# Patient Record
Sex: Male | Born: 1970 | Race: Black or African American | Hispanic: No | Marital: Married | Smoking: Current every day smoker
Health system: Southern US, Community
[De-identification: ages and names within clinical notes are randomized; demographics above are authoritative.]

## PROBLEM LIST (undated history)

## (undated) DIAGNOSIS — F319 Bipolar disorder, unspecified: Secondary | ICD-10-CM

## (undated) DIAGNOSIS — S0990XA Unspecified injury of head, initial encounter: Secondary | ICD-10-CM

## (undated) HISTORY — DX: Bipolar disorder, unspecified: F31.9

## (undated) HISTORY — PX: ACHILLES TENDON SURGERY: SHX542

## (undated) HISTORY — PX: OTHER SURGICAL HISTORY: SHX169

## (undated) HISTORY — DX: Unspecified injury of head, initial encounter: S09.90XA

---

## 2001-04-13 ENCOUNTER — Observation Stay (HOSPITAL_COMMUNITY): Admission: EM | Admit: 2001-04-13 | Discharge: 2001-04-13 | Payer: Self-pay | Admitting: Emergency Medicine

## 2002-09-07 ENCOUNTER — Encounter: Admission: RE | Admit: 2002-09-07 | Discharge: 2002-09-07 | Payer: Self-pay | Admitting: Family Medicine

## 2002-09-07 ENCOUNTER — Encounter: Payer: Self-pay | Admitting: Family Medicine

## 2009-01-01 ENCOUNTER — Emergency Department (HOSPITAL_COMMUNITY): Admission: EM | Admit: 2009-01-01 | Discharge: 2009-01-01 | Payer: Self-pay | Admitting: Emergency Medicine

## 2009-03-05 ENCOUNTER — Emergency Department (HOSPITAL_COMMUNITY): Admission: EM | Admit: 2009-03-05 | Discharge: 2009-03-05 | Payer: Self-pay | Admitting: Emergency Medicine

## 2009-03-11 ENCOUNTER — Emergency Department (HOSPITAL_COMMUNITY): Admission: EM | Admit: 2009-03-11 | Discharge: 2009-03-11 | Payer: Self-pay | Admitting: Emergency Medicine

## 2010-03-09 ENCOUNTER — Inpatient Hospital Stay (HOSPITAL_COMMUNITY): Admission: EM | Admit: 2010-03-09 | Discharge: 2010-03-13 | Payer: Self-pay | Admitting: Emergency Medicine

## 2010-04-28 ENCOUNTER — Encounter: Admission: RE | Admit: 2010-04-28 | Discharge: 2010-07-27 | Payer: Self-pay | Admitting: Orthopedic Surgery

## 2010-06-23 ENCOUNTER — Ambulatory Visit (HOSPITAL_BASED_OUTPATIENT_CLINIC_OR_DEPARTMENT_OTHER): Admission: RE | Admit: 2010-06-23 | Discharge: 2010-06-23 | Payer: Self-pay | Admitting: Orthopedic Surgery

## 2010-07-28 ENCOUNTER — Encounter: Admission: RE | Admit: 2010-07-28 | Discharge: 2010-09-04 | Payer: Self-pay | Admitting: Orthopedic Surgery

## 2011-01-01 LAB — POCT I-STAT, CHEM 8
BUN: 9 mg/dL (ref 6–23)
Creatinine, Ser: 1 mg/dL (ref 0.4–1.5)
Glucose, Bld: 150 mg/dL — ABNORMAL HIGH (ref 70–99)
Potassium: 3.2 mEq/L — ABNORMAL LOW (ref 3.5–5.1)
Sodium: 140 mEq/L (ref 135–145)

## 2011-01-01 LAB — CBC
HCT: 28.5 % — ABNORMAL LOW (ref 39.0–52.0)
HCT: 39.4 % (ref 39.0–52.0)
Hemoglobin: 10.7 g/dL — ABNORMAL LOW (ref 13.0–17.0)
Hemoglobin: 9.7 g/dL — ABNORMAL LOW (ref 13.0–17.0)
MCHC: 33.4 g/dL (ref 30.0–36.0)
MCHC: 33.8 g/dL (ref 30.0–36.0)
MCV: 92 fL (ref 78.0–100.0)
MCV: 92.2 fL (ref 78.0–100.0)
MCV: 92.7 fL (ref 78.0–100.0)
MCV: 93.2 fL (ref 78.0–100.0)
Platelets: 137 10*3/uL — ABNORMAL LOW (ref 150–400)
Platelets: 182 10*3/uL (ref 150–400)
RDW: 12.1 % (ref 11.5–15.5)
RDW: 12.3 % (ref 11.5–15.5)
RDW: 12.6 % (ref 11.5–15.5)

## 2011-01-01 LAB — GLUCOSE, CAPILLARY

## 2011-01-01 LAB — COMPREHENSIVE METABOLIC PANEL
Albumin: 3.8 g/dL (ref 3.5–5.2)
BUN: 10 mg/dL (ref 6–23)
CO2: 24 mEq/L (ref 19–32)
Creatinine, Ser: 1.13 mg/dL (ref 0.4–1.5)
Glucose, Bld: 152 mg/dL — ABNORMAL HIGH (ref 70–99)
Sodium: 138 mEq/L (ref 135–145)
Total Bilirubin: 0.9 mg/dL (ref 0.3–1.2)
Total Protein: 6.8 g/dL (ref 6.0–8.3)

## 2011-01-01 LAB — BASIC METABOLIC PANEL
BUN: 5 mg/dL — ABNORMAL LOW (ref 6–23)
CO2: 27 mEq/L (ref 19–32)
CO2: 29 mEq/L (ref 19–32)
Calcium: 8.2 mg/dL — ABNORMAL LOW (ref 8.4–10.5)
Calcium: 8.3 mg/dL — ABNORMAL LOW (ref 8.4–10.5)
Chloride: 106 mEq/L (ref 96–112)
Creatinine, Ser: 0.83 mg/dL (ref 0.4–1.5)
GFR calc Af Amer: >60 mL/min (ref >60)
Glucose, Bld: 102 mg/dL — ABNORMAL HIGH (ref 70–99)
Glucose, Bld: 106 mg/dL — ABNORMAL HIGH (ref 70–99)
Glucose, Bld: 113 mg/dL — ABNORMAL HIGH (ref 70–99)
Potassium: 3.8 mEq/L (ref 3.5–5.1)
Potassium: 3.9 mEq/L (ref 3.5–5.1)
Sodium: 141 mEq/L (ref 135–145)

## 2011-01-01 LAB — PROTIME-INR
INR: 1.17 (ref 0.00–1.49)
INR: 2.03 — ABNORMAL HIGH (ref 0.00–1.49)
Prothrombin Time: 14.1 seconds (ref 11.6–15.2)
Prothrombin Time: 14.8 seconds (ref 11.6–15.2)

## 2011-01-01 LAB — SAMPLE TO BLOOD BANK

## 2011-01-01 LAB — LACTIC ACID, PLASMA: Lactic Acid, Venous: 1.6 mmol/L (ref 0.5–2.2)

## 2011-01-01 LAB — ETHANOL: Alcohol, Ethyl (B): <5 mg/dL (ref 0–10)

## 2011-01-21 IMAGING — CR DG FEMUR 2+V PORT*R*
4 series · 4 of 4 positions shown · non-contrast
Comparison: Intraoperative imaging same date

CLINICAL DATA: Postoperative exam after fixation of right femoral
fracture

PORTABLE RIGHT FEMUR - 2 VIEW

[femur lat (1 of 2)]
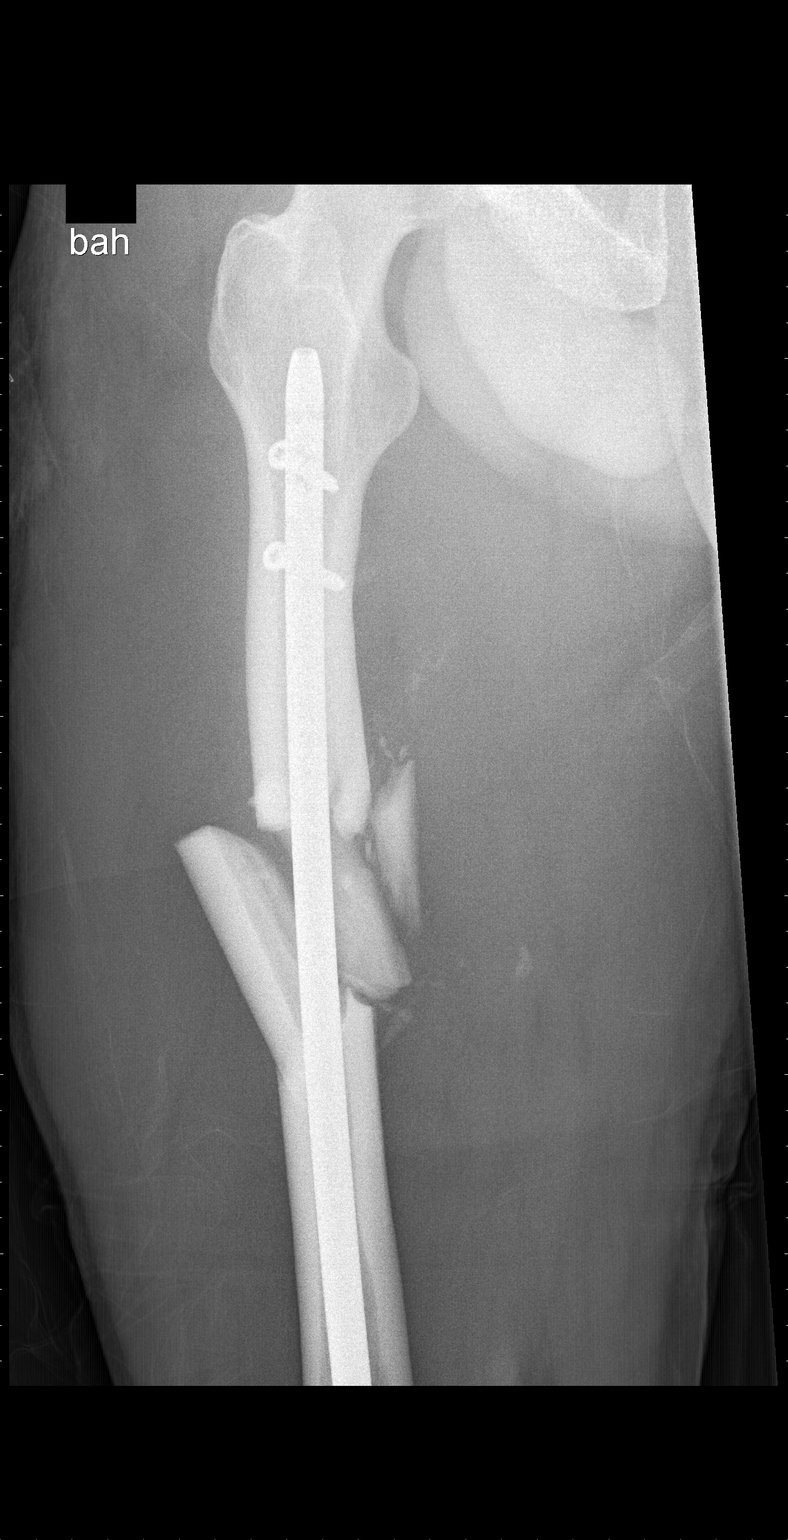

[AP (1 of 2)]
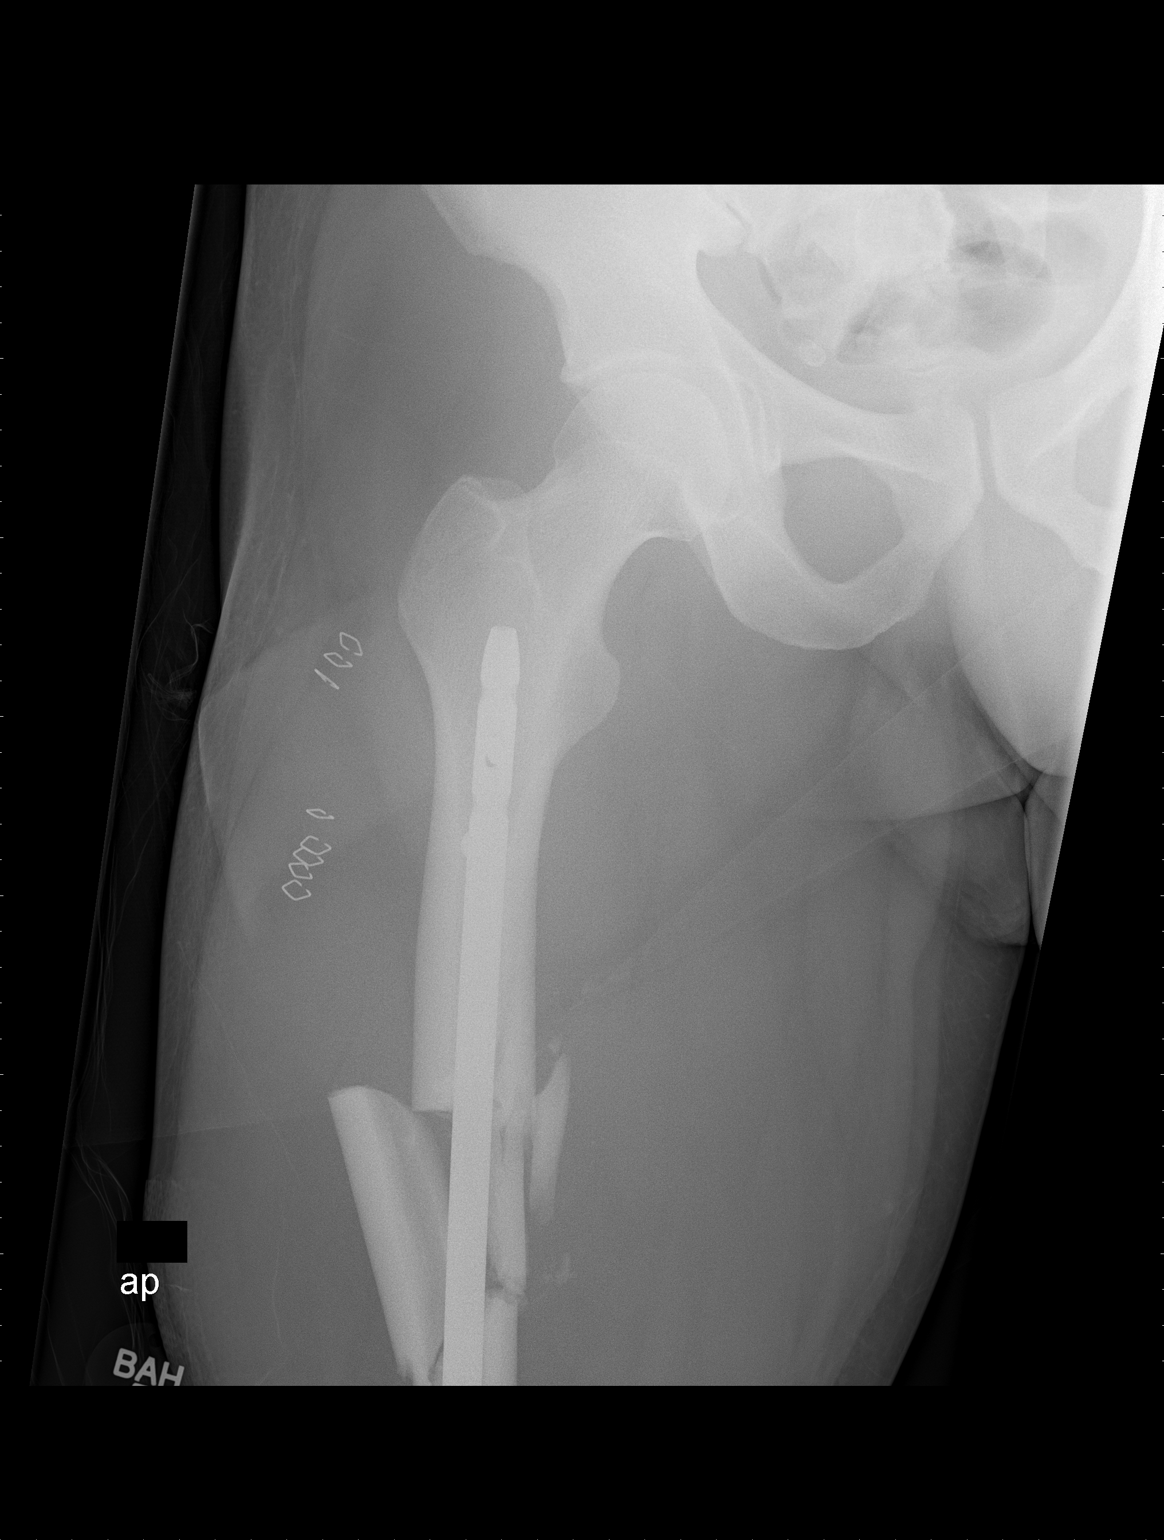

[AP (2 of 2)]
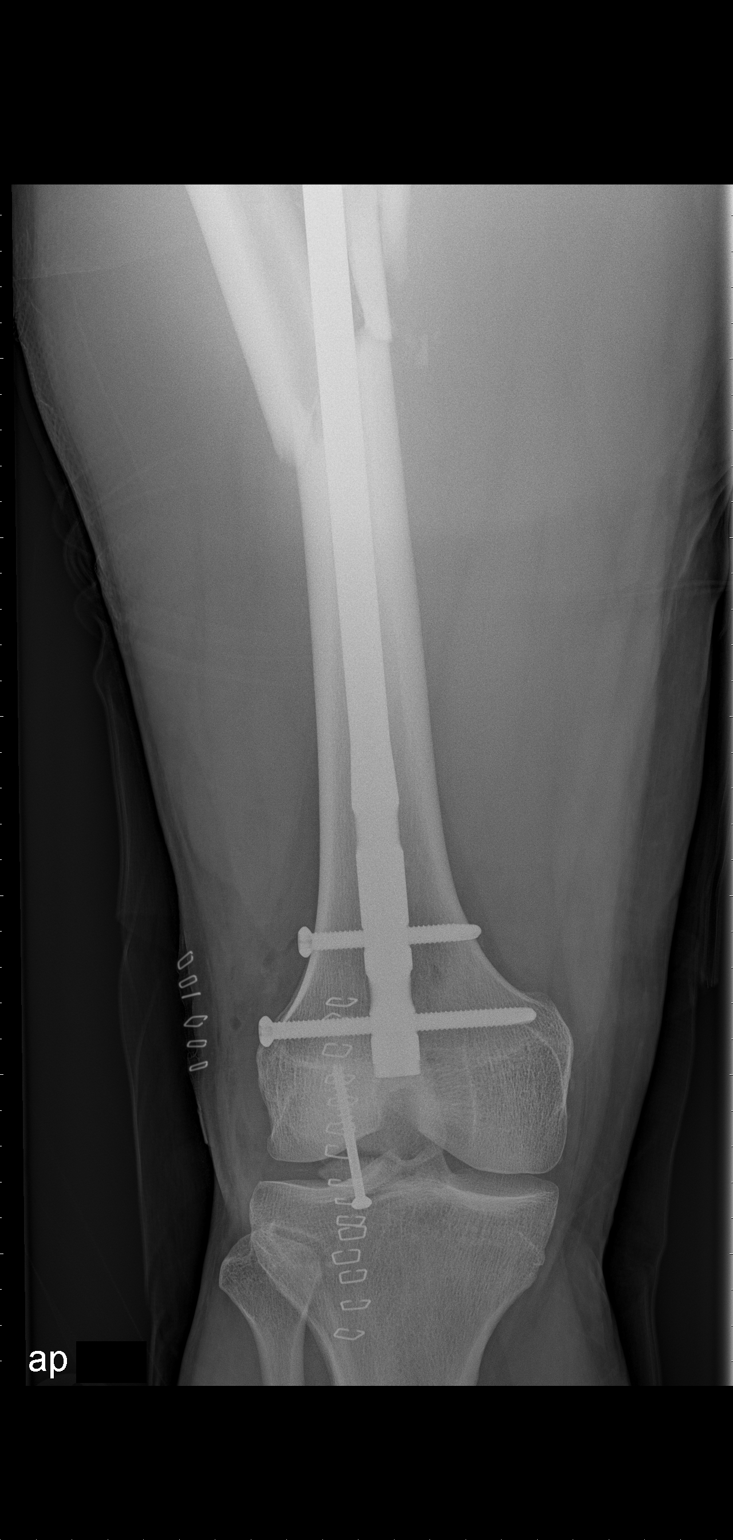

[femur lat (2 of 2)]
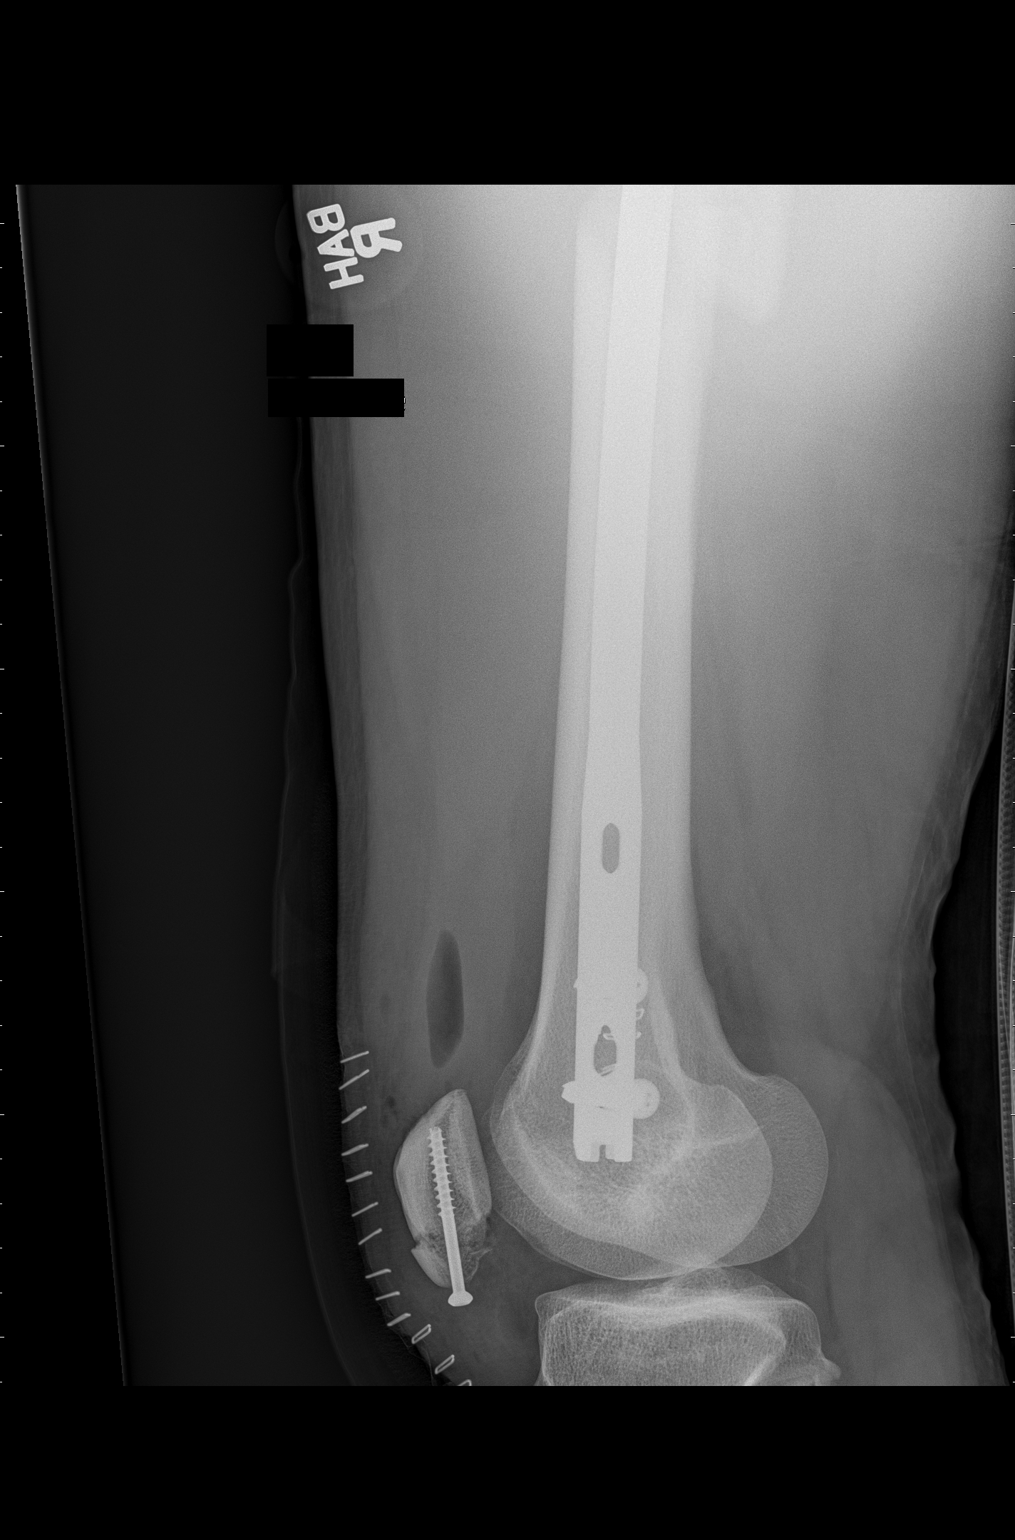

[4 of 4 positions shown; findings below may reference images not displayed]

FINDINGS: Right intramedullary femoral rod noted fixing previously
seen fracture fragment, in near anatomic alignment.  No evidence
for hardware failure.  Patellar screw noted fixing previously seen
fracture.  Overlying skin staples are present.  Lipohemarthrosis
noted.
IMPRESSION: Expected appearance after intraoperative fixation right femoral
midshaft comminuted fracture and patellar fracture.
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
## 2014-09-21 ENCOUNTER — Ambulatory Visit (INDEPENDENT_AMBULATORY_CARE_PROVIDER_SITE_OTHER): Payer: BC Managed Care – PPO | Admitting: Neurology

## 2014-09-21 ENCOUNTER — Encounter: Payer: Self-pay | Admitting: Neurology

## 2014-09-21 VITALS — BP 105/71 | HR 83 | Ht 65.0 in | Wt 176.8 lb

## 2014-09-21 DIAGNOSIS — R55 Syncope and collapse: Secondary | ICD-10-CM | POA: Insufficient documentation

## 2014-09-21 DIAGNOSIS — R413 Other amnesia: Secondary | ICD-10-CM | POA: Insufficient documentation
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
## 2014-09-21 NOTE — Patient Instructions (Signed)
Near-Syncope °Near-syncope (commonly known as near fainting) is sudden weakness, dizziness, or feeling like you might pass out. This can happen when getting up or while standing for a long time. It is caused by a sudden decrease in blood flow to the brain, which can occur for various reasons. Most of the reasons are not serious.  °HOME CARE °Watch your condition for any changes. °· Have someone stay with you until you feel stable. °· If you feel like you are going to pass out: °¨ Lie down right away. °¨ Prop your feet up if you can. °¨ Breathe deeply and steadily. °¨ Move only when the feeling has gone away. Most of the time, this feeling lasts only a few minutes. You may feel tired for several hours. °· Drink enough fluids to keep your pee (urine) clear or pale yellow. °· If you are taking blood pressure or heart medicine, stand up slowly. °· Follow up with your doctor as told. °GET HELP RIGHT AWAY IF:  °· You have a severe headache. °· You have unusual pain in the chest, belly (abdomen), or back. °· You have bleeding from the mouth or butt (rectum), or you have black or tarry poop (stool). °· You feel your heart beat differently than normal, or you have a very fast pulse. °· You pass out, or you twitch and shake when you pass out. °· You pass out when sitting or lying down. °· You feel confused. °· You have trouble walking. °· You are weak. °· You have vision problems. °MAKE SURE YOU:  °· Understand these instructions. °· Will watch your condition. °· Will get help right away if you are not doing well or get worse. °Document Released: 03/19/2008 Document Revised: 10/06/2013 Document Reviewed: 03/06/2013 °[employer]® Patient Information ©2015 [employer]. This information is not intended to replace advice given to you by your health care provider. Make sure you discuss any questions you have with your health care provider. ° °

## 2014-09-22 ENCOUNTER — Telehealth: Payer: Self-pay | Admitting: Neurology

## 2014-09-22 ENCOUNTER — Ambulatory Visit (INDEPENDENT_AMBULATORY_CARE_PROVIDER_SITE_OTHER): Payer: BC Managed Care – PPO | Admitting: Neurology

## 2014-09-22 DIAGNOSIS — R55 Syncope and collapse: Secondary | ICD-10-CM

## 2014-09-22 LAB — RPR: SYPHILIS RPR SCR: NONREACTIVE

## 2014-09-22 LAB — HIV ANTIBODY (ROUTINE TESTING W REFLEX)
HIV 1/O/2 Abs-Index Value: <1 (ref <1.00)
HIV-1/HIV-2 Ab: NONREACTIVE

## 2014-09-22 LAB — ANA W/REFLEX: Anti Nuclear Antibody(ANA): NEGATIVE

## 2014-09-22 LAB — TSH: TSH: 0.67 u[IU]/mL (ref 0.450–4.500)

## 2014-09-22 LAB — VITAMIN B12: Vitamin B-12: 676 pg/mL (ref 211–946)

## 2014-09-22 LAB — SEDIMENTATION RATE: SED RATE: 8 mm/h (ref 0–15)

## 2014-09-22 NOTE — Telephone Encounter (Signed)
I called patient. The EEG study was normal, blood work that was performed was normal. The MRI the brain and the carotid Doppler study are pending.

## 2014-09-22 NOTE — Procedures (Signed)
    History:  Thomas Gregory is a 43 year old gentleman with an episode that occurred on 09/07/2014 associated with tunnel vision, dizziness, and near-syncope. The patient also reported some right hand numbness. The patient is being evaluated for this event.  This is a routine EEG. No skull defects are noted. Medications include Seroquel and Valtrex.   EEG classification: Normal awake and drowsy  Description of the recording: The background rhythms of this recording consists of a fairly well modulated medium amplitude alpha rhythm of 10 Hz that is reactive to eye opening and closure. As the record progresses, the patient appears to remain in the waking state throughout the recording. Photic stimulation was performed, resulting in a bilateral and symmetric photic driving response. Hyperventilation was also performed, resulting in a minimal buildup of the background rhythm activities without significant slowing seen. Toward the end of the recording, the patient enters the drowsy state with slight symmetric slowing seen. The patient never enters stage II sleep. At no time during the recording does there appear to be evidence of spike or spike wave discharges or evidence of focal slowing. EKG monitor shows no evidence of cardiac rhythm abnormalities with a heart rate of 60.  Impression: This is a normal EEG recording in the waking and drowsy state. No evidence of ictal or interictal discharges are seen.

## 2014-10-20 ENCOUNTER — Ambulatory Visit (INDEPENDENT_AMBULATORY_CARE_PROVIDER_SITE_OTHER): Payer: BLUE CROSS/BLUE SHIELD

## 2014-10-20 DIAGNOSIS — R55 Syncope and collapse: Secondary | ICD-10-CM

## 2014-10-23 ENCOUNTER — Telehealth: Payer: Self-pay | Admitting: Neurology

## 2014-10-23 NOTE — Telephone Encounter (Signed)
I called the patient. The carotid doppler study is normal. MRI of the brain is pending. The home number is invalid and he did not answer the cell number. Will call back later.

## 2014-10-25 ENCOUNTER — Telehealth: Payer: Self-pay | Admitting: Radiology

## 2014-10-25 NOTE — Telephone Encounter (Signed)
Spoke with patient and informed him Carotid doppler was ok per Dr. Anne HahnWillis.

## 2014-10-29 ENCOUNTER — Other Ambulatory Visit: Payer: Self-pay | Admitting: *Deleted

## 2014-11-04 ENCOUNTER — Ambulatory Visit
Admission: RE | Admit: 2014-11-04 | Discharge: 2014-11-04 | Disposition: A | Discharge status: Home / Self Care | Payer: BLUE CROSS/BLUE SHIELD | Source: Ambulatory Visit | Attending: Neurology | Admitting: Neurology

## 2014-11-04 DIAGNOSIS — R55 Syncope and collapse: Secondary | ICD-10-CM

## 2014-11-08 ENCOUNTER — Telehealth: Payer: Self-pay | Admitting: Neurology
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
## 2014-11-30 ENCOUNTER — Other Ambulatory Visit: Payer: Self-pay | Admitting: Family Medicine

## 2014-11-30 ENCOUNTER — Ambulatory Visit
Admission: RE | Admit: 2014-11-30 | Discharge: 2014-11-30 | Disposition: A | Discharge status: Home / Self Care | Payer: BLUE CROSS/BLUE SHIELD | Source: Ambulatory Visit | Attending: Family Medicine | Admitting: Family Medicine

## 2014-11-30 DIAGNOSIS — R0602 Shortness of breath: Secondary | ICD-10-CM

## 2014-12-23 ENCOUNTER — Telehealth: Payer: Self-pay | Admitting: Neurology

## 2014-12-23 ENCOUNTER — Ambulatory Visit: Payer: BLUE CROSS/BLUE SHIELD | Admitting: Neurology

## 2014-12-23 NOTE — Telephone Encounter (Signed)
Pt showed up to his 8:30AM appt at 9:11AM. Dr. Allena KatzPatel okay for pt to r/s appt. Pt is r/s to 01/20/15. DR. Fulp/ referring provider was notified.

## 2015-01-20 ENCOUNTER — Ambulatory Visit: Payer: BLUE CROSS/BLUE SHIELD | Admitting: Neurology

## 2015-01-21 ENCOUNTER — Ambulatory Visit: Payer: Self-pay | Admitting: Neurology

## 2015-01-21 ENCOUNTER — Telehealth: Payer: Self-pay | Admitting: Neurology

## 2015-01-21 NOTE — Telephone Encounter (Signed)
NP no show w/ Dr. Allena KatzPatel x 2. No show letter mailed to pt and CC'ed to Dr. Jillyn HiddenFulp / Oneita KrasSherri S.

## 2015-08-22 ENCOUNTER — Telehealth: Payer: Self-pay | Admitting: Neurology

## 2015-08-22 ENCOUNTER — Encounter: Payer: Self-pay | Admitting: Neurology

## 2015-08-22 NOTE — Telephone Encounter (Signed)
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

## 2015-08-22 NOTE — Telephone Encounter (Signed)
I called patient. I have written a letter allowing him to operate a motor vehicle without restrictions.

## 2015-10-14 IMAGING — CR DG CHEST 2V
2 series · 2 of 2 positions shown · non-contrast
Comparison: Radiographs and CT 03/09/2010.

CLINICAL DATA: Shortness of breath for 1 month. Some palpitations.
History of smoking. Initial encounter.

EXAM:
CHEST  2 VIEW

[w chest pa]
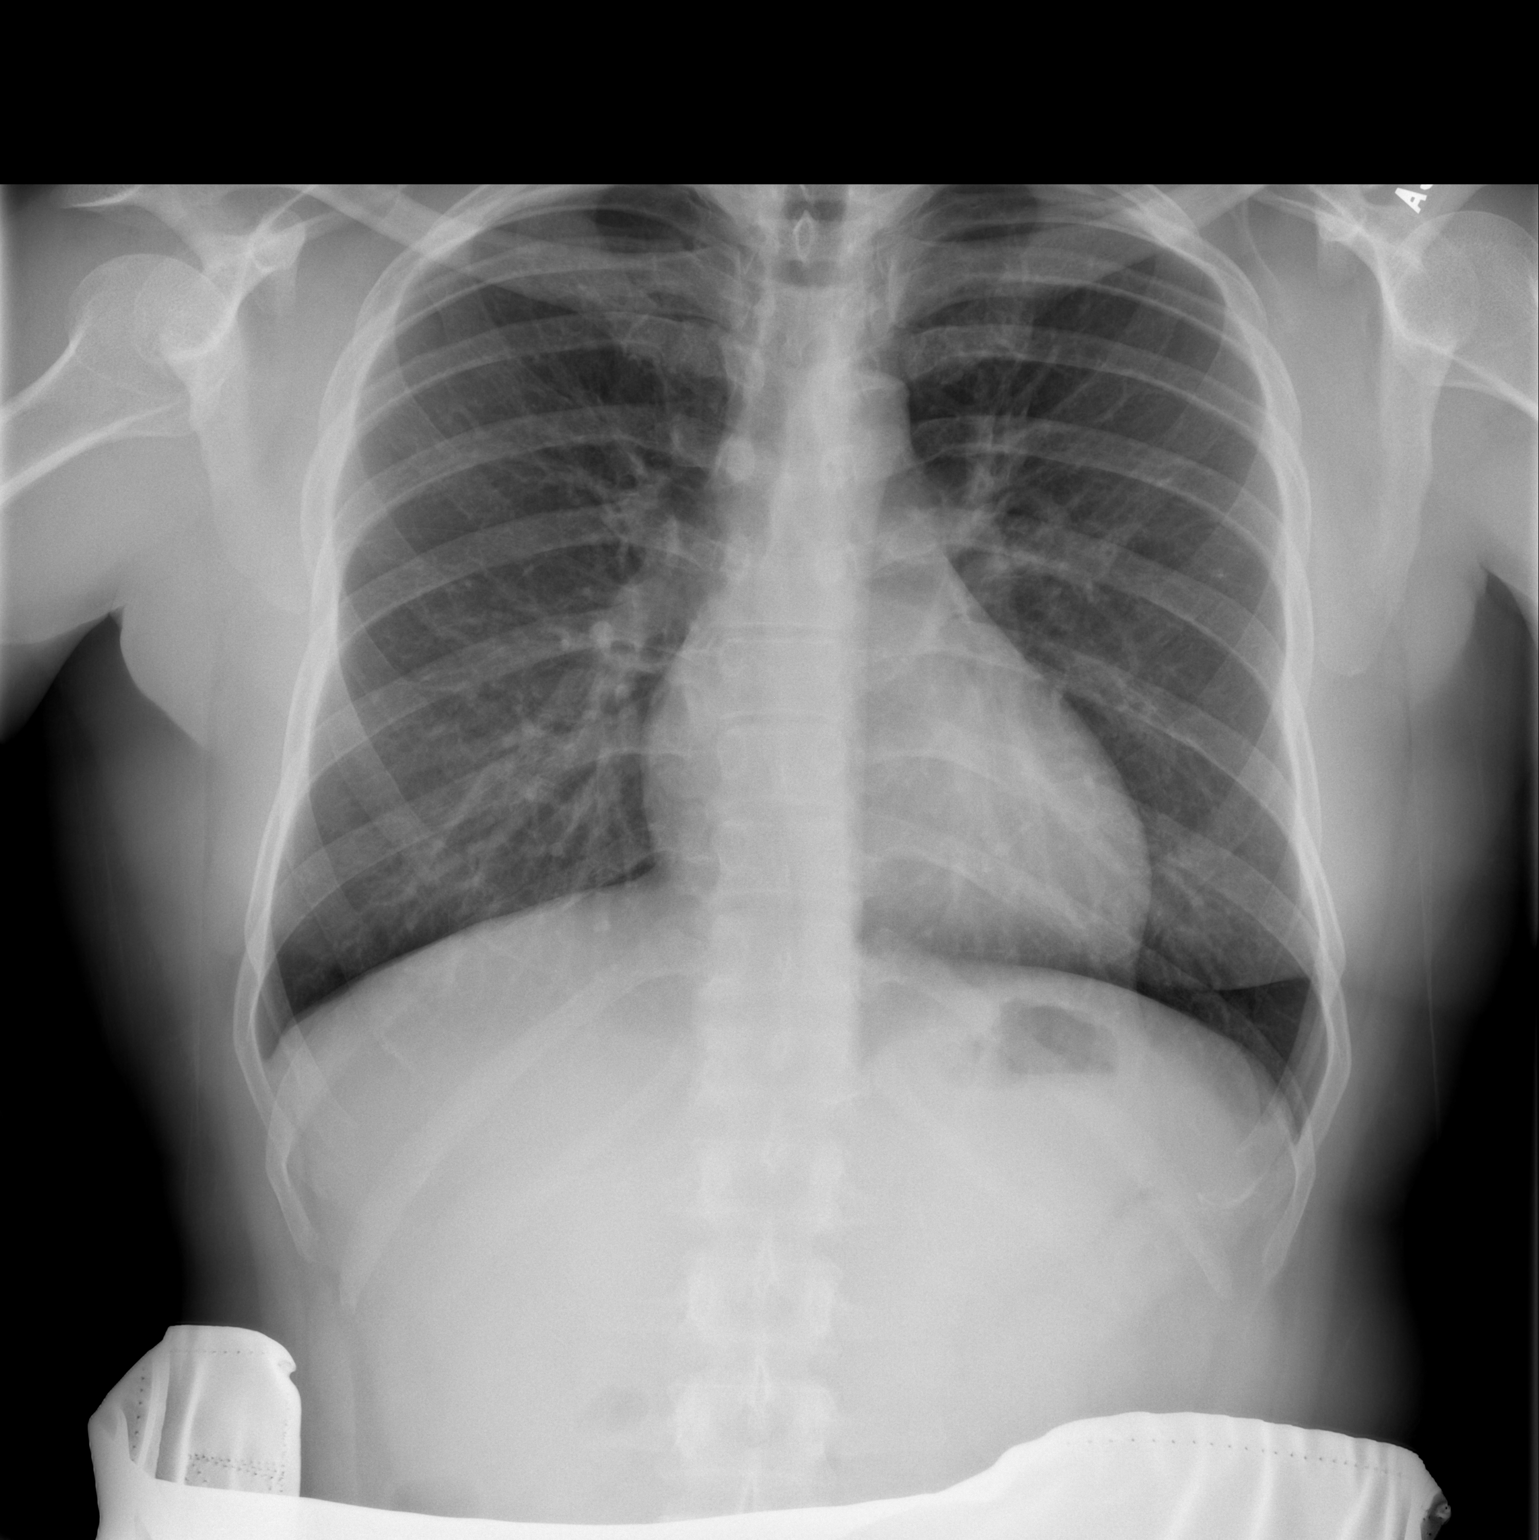

[w chest lat]
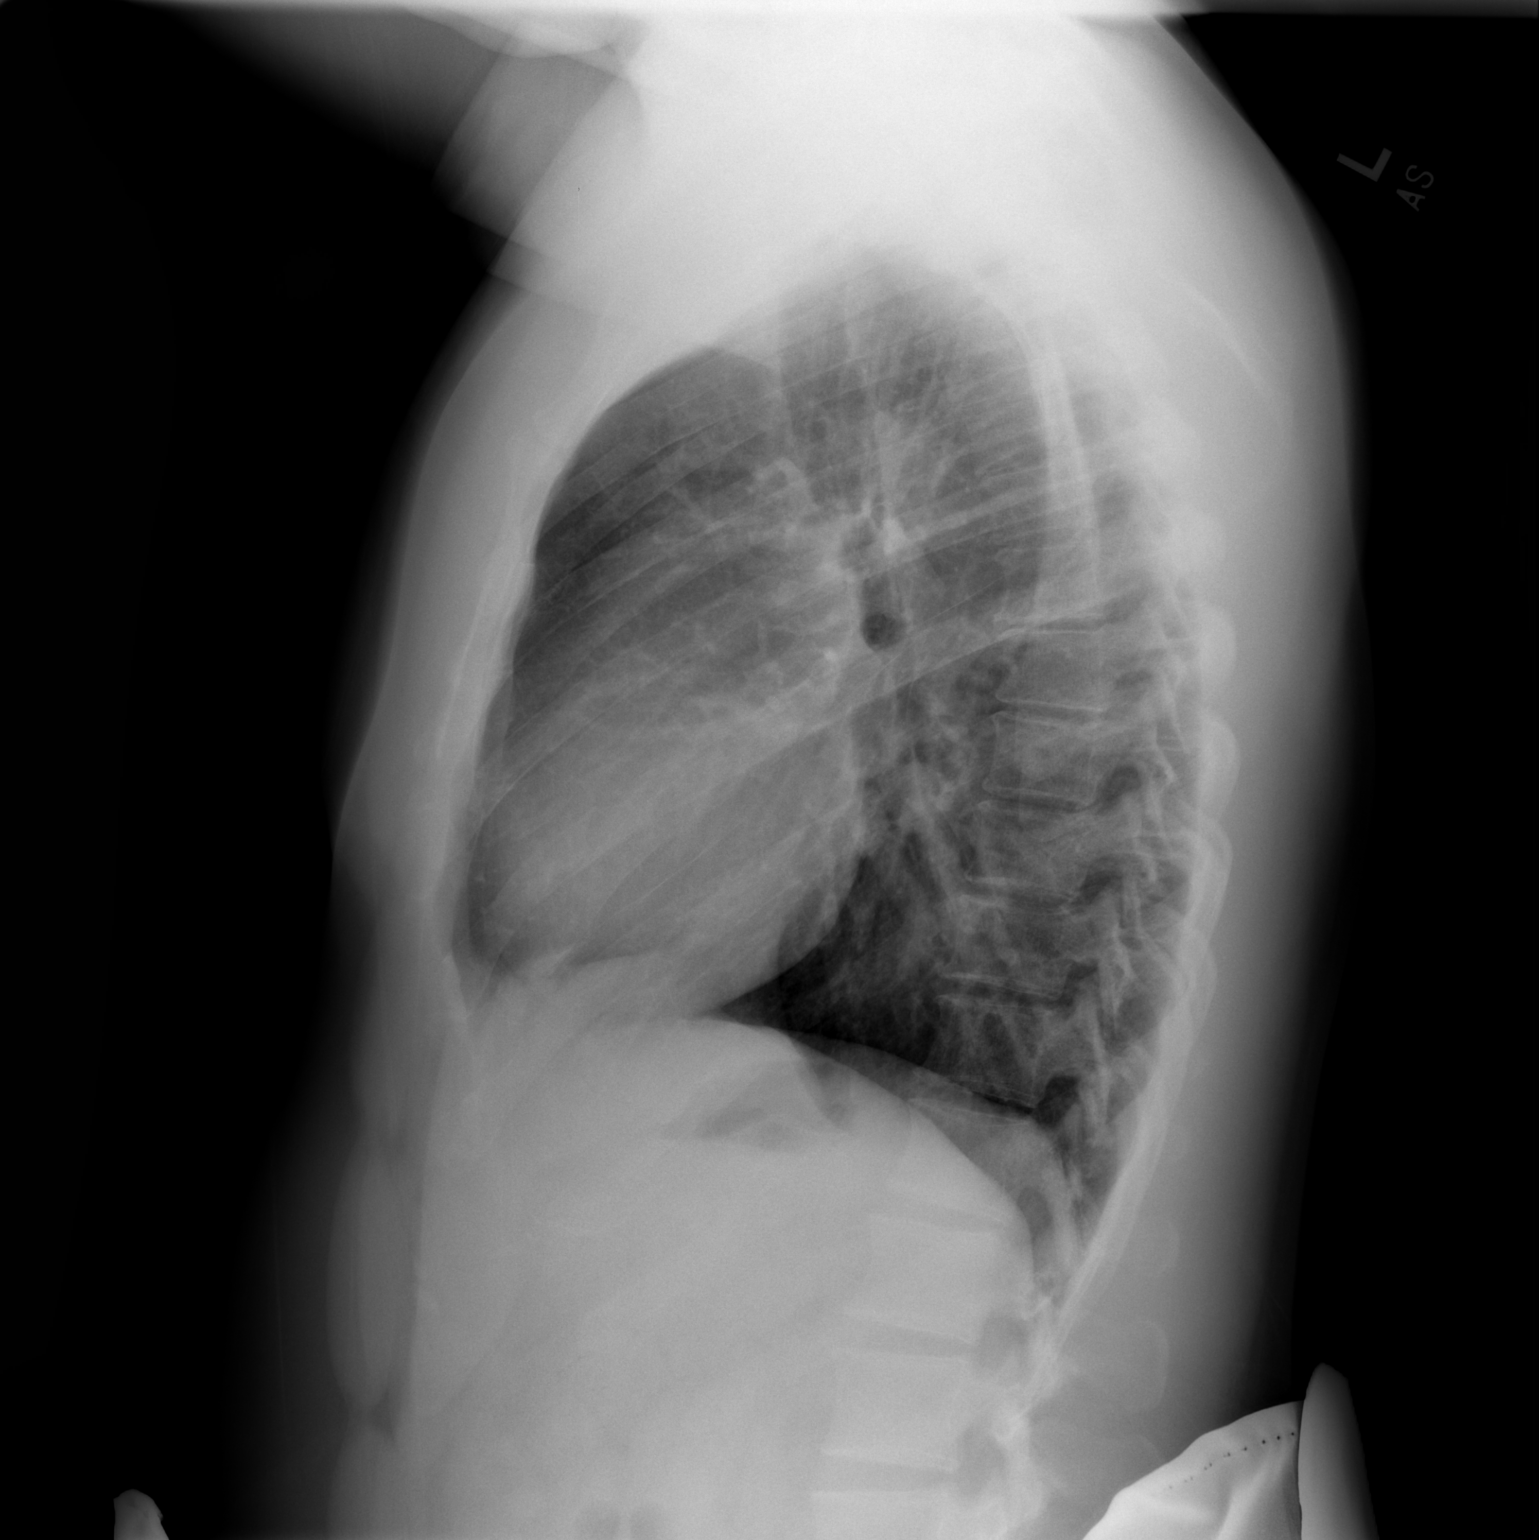

[2 of 2 positions shown; findings below may reference images not displayed]

FINDINGS: The heart size and mediastinal contours are normal. The lungs are
clear. There is no pleural effusion or pneumothorax. No acute
osseous findings are identified.
IMPRESSION: No active cardiopulmonary process.
